# Patient Record
Sex: Male | Born: 2000 | Race: White | Hispanic: No | Marital: Single | Smoking: Never smoker
Health system: Southern US, Community
[De-identification: ages and names within clinical notes are randomized; demographics above are authoritative.]

## PROBLEM LIST (undated history)

## (undated) HISTORY — PX: NO PAST SURGERIES: SHX2092

---
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
## 2021-04-23 HISTORY — PX: WRIST ARTHROSCOPY: SUR100

## 2021-06-03 DIAGNOSIS — M25531 Pain in right wrist: Secondary | ICD-10-CM | POA: Diagnosis not present

## 2021-06-03 DIAGNOSIS — M25532 Pain in left wrist: Secondary | ICD-10-CM | POA: Diagnosis not present

## 2021-06-10 DIAGNOSIS — M25532 Pain in left wrist: Secondary | ICD-10-CM | POA: Diagnosis not present

## 2021-06-10 DIAGNOSIS — M6281 Muscle weakness (generalized): Secondary | ICD-10-CM | POA: Diagnosis not present

## 2021-06-12 DIAGNOSIS — M25532 Pain in left wrist: Secondary | ICD-10-CM | POA: Diagnosis not present

## 2021-06-12 DIAGNOSIS — M6281 Muscle weakness (generalized): Secondary | ICD-10-CM | POA: Diagnosis not present

## 2021-06-15 DIAGNOSIS — M25532 Pain in left wrist: Secondary | ICD-10-CM | POA: Diagnosis not present

## 2021-06-15 DIAGNOSIS — M6281 Muscle weakness (generalized): Secondary | ICD-10-CM | POA: Diagnosis not present

## 2021-06-18 DIAGNOSIS — M6281 Muscle weakness (generalized): Secondary | ICD-10-CM | POA: Diagnosis not present

## 2021-06-18 DIAGNOSIS — M25532 Pain in left wrist: Secondary | ICD-10-CM | POA: Diagnosis not present

## 2021-06-22 DIAGNOSIS — M25532 Pain in left wrist: Secondary | ICD-10-CM | POA: Diagnosis not present

## 2021-06-22 DIAGNOSIS — M6281 Muscle weakness (generalized): Secondary | ICD-10-CM | POA: Diagnosis not present

## 2021-06-25 DIAGNOSIS — M25532 Pain in left wrist: Secondary | ICD-10-CM | POA: Diagnosis not present

## 2021-06-25 DIAGNOSIS — M6281 Muscle weakness (generalized): Secondary | ICD-10-CM | POA: Diagnosis not present

## 2021-06-29 DIAGNOSIS — M6281 Muscle weakness (generalized): Secondary | ICD-10-CM | POA: Diagnosis not present

## 2021-06-29 DIAGNOSIS — M25532 Pain in left wrist: Secondary | ICD-10-CM | POA: Diagnosis not present

## 2021-07-02 DIAGNOSIS — M6281 Muscle weakness (generalized): Secondary | ICD-10-CM | POA: Diagnosis not present

## 2021-07-02 DIAGNOSIS — M25532 Pain in left wrist: Secondary | ICD-10-CM | POA: Diagnosis not present

## 2021-07-06 DIAGNOSIS — M25532 Pain in left wrist: Secondary | ICD-10-CM | POA: Diagnosis not present

## 2021-07-06 DIAGNOSIS — M6281 Muscle weakness (generalized): Secondary | ICD-10-CM | POA: Diagnosis not present

## 2021-07-09 DIAGNOSIS — M25532 Pain in left wrist: Secondary | ICD-10-CM | POA: Diagnosis not present

## 2021-07-09 DIAGNOSIS — M6281 Muscle weakness (generalized): Secondary | ICD-10-CM | POA: Diagnosis not present

## 2021-07-13 DIAGNOSIS — M25532 Pain in left wrist: Secondary | ICD-10-CM | POA: Diagnosis not present

## 2021-07-13 DIAGNOSIS — M6281 Muscle weakness (generalized): Secondary | ICD-10-CM | POA: Diagnosis not present

## 2021-07-16 DIAGNOSIS — M6281 Muscle weakness (generalized): Secondary | ICD-10-CM | POA: Diagnosis not present

## 2021-07-16 DIAGNOSIS — M25532 Pain in left wrist: Secondary | ICD-10-CM | POA: Diagnosis not present

## 2021-11-30 DIAGNOSIS — N390 Urinary tract infection, site not specified: Secondary | ICD-10-CM | POA: Diagnosis not present

## 2021-11-30 DIAGNOSIS — R319 Hematuria, unspecified: Secondary | ICD-10-CM | POA: Diagnosis not present

## 2021-12-12 NOTE — Patient Instructions (Signed)

## 2021-12-15 ENCOUNTER — Encounter: Payer: Self-pay | Admitting: Nurse Practitioner

## 2021-12-15 ENCOUNTER — Ambulatory Visit (INDEPENDENT_AMBULATORY_CARE_PROVIDER_SITE_OTHER): Payer: BC Managed Care – PPO | Admitting: Nurse Practitioner

## 2021-12-15 VITALS — BP 97/61 | HR 61 | Temp 98.1°F | Ht 71.0 in | Wt 178.3 lb

## 2021-12-15 DIAGNOSIS — M25532 Pain in left wrist: Secondary | ICD-10-CM | POA: Diagnosis not present

## 2021-12-15 DIAGNOSIS — M25531 Pain in right wrist: Secondary | ICD-10-CM

## 2021-12-15 DIAGNOSIS — Z13220 Encounter for screening for lipoid disorders: Secondary | ICD-10-CM

## 2021-12-15 DIAGNOSIS — Z136 Encounter for screening for cardiovascular disorders: Secondary | ICD-10-CM

## 2021-12-15 DIAGNOSIS — Z0000 Encounter for general adult medical examination without abnormal findings: Secondary | ICD-10-CM | POA: Diagnosis not present

## 2021-12-15 DIAGNOSIS — R0981 Nasal congestion: Secondary | ICD-10-CM
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
## 2021-12-15 NOTE — Assessment & Plan Note (Signed)
Continue to collaborate with ortho, recent notes reviewed.

## 2021-12-16 LAB — CBC WITH DIFFERENTIAL/PLATELET
Basophils Absolute: 0 10*3/uL (ref 0.0–0.2)
Basos: 1 %
EOS (ABSOLUTE): 0.1 10*3/uL (ref 0.0–0.4)
Eos: 1 %
Hematocrit: 44.2 % (ref 37.5–51.0)
Hemoglobin: 15.4 g/dL (ref 13.0–17.7)
Immature Grans (Abs): 0 10*3/uL (ref 0.0–0.1)
Immature Granulocytes: 0 %
Lymphocytes Absolute: 1.8 10*3/uL (ref 0.7–3.1)
Lymphs: 32 %
MCH: 29.3 pg (ref 26.6–33.0)
MCHC: 34.8 g/dL (ref 31.5–35.7)
MCV: 84 fL (ref 79–97)
Monocytes Absolute: 0.6 10*3/uL (ref 0.1–0.9)
Monocytes: 10 %
Neutrophils Absolute: 3.2 10*3/uL (ref 1.4–7.0)
Neutrophils: 56 %
Platelets: 228 10*3/uL (ref 150–450)
RBC: 5.25 x10E6/uL (ref 4.14–5.80)
RDW: 12.9 % (ref 11.6–15.4)
WBC: 5.7 10*3/uL (ref 3.4–10.8)

## 2021-12-16 LAB — COMPREHENSIVE METABOLIC PANEL
ALT: 32 IU/L (ref 0–44)
AST: 24 IU/L (ref 0–40)
Albumin/Globulin Ratio: 1.9 (ref 1.2–2.2)
Albumin: 4.9 g/dL (ref 4.3–5.2)
Alkaline Phosphatase: 104 IU/L (ref 51–125)
BUN/Creatinine Ratio: 11 (ref 9–20)
BUN: 10 mg/dL (ref 6–20)
Bilirubin Total: 0.8 mg/dL (ref 0.0–1.2)
CO2: 24 mmol/L (ref 20–29)
Calcium: 10.2 mg/dL (ref 8.7–10.2)
Chloride: 102 mmol/L (ref 96–106)
Creatinine, Ser: 0.9 mg/dL (ref 0.76–1.27)
Globulin, Total: 2.6 g/dL (ref 1.5–4.5)
Glucose: 83 mg/dL (ref 70–99)
Potassium: 4 mmol/L (ref 3.5–5.2)
Sodium: 140 mmol/L (ref 134–144)
Total Protein: 7.5 g/dL (ref 6.0–8.5)
eGFR: 125 mL/min/{1.73_m2} (ref >59)

## 2021-12-16 LAB — LIPID PANEL W/O CHOL/HDL RATIO
Cholesterol, Total: 133 mg/dL (ref 100–199)
HDL: 37 mg/dL — ABNORMAL LOW (ref >39)
LDL Chol Calc (NIH): 82 mg/dL (ref 0–99)
Triglycerides: 71 mg/dL (ref 0–149)
VLDL Cholesterol Cal: 14 mg/dL (ref 5–40)

## 2021-12-16 LAB — TSH: TSH: 1.09 u[IU]/mL (ref 0.450–4.500)

## 2021-12-16 NOTE — Progress Notes (Signed)
Contacted via MyChart   Good morning Joshua Cardenas, your labs have returned and are overall nice and normal.  These are great labs!!  Keep up the good work at taking care of your health. Keep being awesome!!  Thank you for allowing me to participate in your care.  I appreciate you. Kindest regards, Arlicia Paquette

## 2021-12-30 DIAGNOSIS — J3489 Other specified disorders of nose and nasal sinuses: Secondary | ICD-10-CM | POA: Diagnosis not present

## 2021-12-30 DIAGNOSIS — J309 Allergic rhinitis, unspecified: Secondary | ICD-10-CM | POA: Diagnosis not present

## 2022-01-08 DIAGNOSIS — L720 Epidermal cyst: Secondary | ICD-10-CM | POA: Diagnosis not present

## 2022-01-08 DIAGNOSIS — L818 Other specified disorders of pigmentation: Secondary | ICD-10-CM | POA: Diagnosis not present

## 2022-05-06 DIAGNOSIS — J3489 Other specified disorders of nose and nasal sinuses: Secondary | ICD-10-CM | POA: Diagnosis not present

## 2022-05-06 DIAGNOSIS — S0992XS Unspecified injury of nose, sequela: Secondary | ICD-10-CM | POA: Diagnosis not present

## 2022-05-06 DIAGNOSIS — M950 Acquired deformity of nose: Secondary | ICD-10-CM | POA: Diagnosis not present

## 2022-05-06 DIAGNOSIS — J342 Deviated nasal septum: Secondary | ICD-10-CM | POA: Diagnosis not present

## 2022-09-22 HISTORY — PX: NASAL SEPTUM SURGERY: SHX37

## 2022-09-30 DIAGNOSIS — J342 Deviated nasal septum: Secondary | ICD-10-CM | POA: Diagnosis not present

## 2022-09-30 DIAGNOSIS — S0992XS Unspecified injury of nose, sequela: Secondary | ICD-10-CM | POA: Diagnosis not present

## 2022-09-30 DIAGNOSIS — J3489 Other specified disorders of nose and nasal sinuses: Secondary | ICD-10-CM | POA: Diagnosis not present

## 2022-09-30 DIAGNOSIS — M950 Acquired deformity of nose: Secondary | ICD-10-CM | POA: Diagnosis not present

## 2022-10-13 DIAGNOSIS — Z79899 Other long term (current) drug therapy: Secondary | ICD-10-CM | POA: Diagnosis not present

## 2022-10-13 DIAGNOSIS — J342 Deviated nasal septum: Secondary | ICD-10-CM | POA: Diagnosis not present

## 2022-10-13 DIAGNOSIS — J343 Hypertrophy of nasal turbinates: Secondary | ICD-10-CM | POA: Diagnosis not present

## 2022-10-13 DIAGNOSIS — J3489 Other specified disorders of nose and nasal sinuses: Secondary | ICD-10-CM | POA: Diagnosis not present

## 2022-10-13 DIAGNOSIS — M950 Acquired deformity of nose: Secondary | ICD-10-CM | POA: Diagnosis not present

## 2022-10-14 DIAGNOSIS — Z79899 Other long term (current) drug therapy: Secondary | ICD-10-CM | POA: Diagnosis not present

## 2022-10-14 DIAGNOSIS — M950 Acquired deformity of nose: Secondary | ICD-10-CM | POA: Diagnosis not present

## 2022-10-14 DIAGNOSIS — J3489 Other specified disorders of nose and nasal sinuses: Secondary | ICD-10-CM | POA: Diagnosis not present

## 2022-10-14 DIAGNOSIS — J342 Deviated nasal septum: Secondary | ICD-10-CM | POA: Diagnosis not present

## 2022-12-12 NOTE — Patient Instructions (Signed)

## 2022-12-17 ENCOUNTER — Ambulatory Visit (INDEPENDENT_AMBULATORY_CARE_PROVIDER_SITE_OTHER): Payer: BC Managed Care – PPO | Admitting: Nurse Practitioner

## 2022-12-17 ENCOUNTER — Encounter: Payer: Self-pay | Admitting: Nurse Practitioner

## 2022-12-17 VITALS — BP 121/78 | HR 59 | Temp 98.1°F | Ht 70.6 in | Wt 181.4 lb

## 2022-12-17 DIAGNOSIS — Z13220 Encounter for screening for lipoid disorders: Secondary | ICD-10-CM

## 2022-12-17 DIAGNOSIS — Z0000 Encounter for general adult medical examination without abnormal findings: Secondary | ICD-10-CM

## 2022-12-17 DIAGNOSIS — Z136 Encounter for screening for cardiovascular disorders: Secondary | ICD-10-CM | POA: Diagnosis not present

## 2022-12-17 DIAGNOSIS — R0981 Nasal congestion: Secondary | ICD-10-CM | POA: Diagnosis not present

## 2022-12-17 NOTE — Assessment & Plan Note (Signed)
Ongoing issue, continue current regimen and collaboration with ENT as needed.

## 2022-12-17 NOTE — Progress Notes (Signed)
BP 121/78   Pulse (!) 59   Temp 98.1 F (36.7 C) (Oral)   Ht 5' 10.6" (1.793 m)   Wt 181 lb 6.4 oz (82.3 kg)   SpO2 98%   BMI 25.59 kg/m    Subjective:    Patient ID: Joshua Cardenas, male    DOB: [birth year], 22 y.o.   MRN: [id number]  HPI: Joshua Cardenas is a 22 y.o. male presenting on 12/17/2022 for comprehensive medical examination. Current medical complaints include: congestion  He currently lives with: family Interim Problems from his last visit:  as above  Functional Status Survey: Is the patient deaf or have difficulty hearing?: No Does the patient have difficulty seeing, even when wearing glasses/contacts?: No Does the patient have difficulty concentrating, remembering, or making decisions?: No Does the patient have difficulty walking or climbing stairs?: No Does the patient have difficulty dressing or bathing?: No Does the patient have difficulty doing errands alone such as visiting a doctor's office or shopping?: No  FALL RISK:    12/17/2022    3:07 PM 12/15/2021    3:15 PM  Fall Risk   Falls in the past year? 0 0  Number falls in past yr: 0 0  Injury with Fall? 0 0  Risk for fall due to : No Fall Risks No Fall Risks  Follow up Falls evaluation completed Falls evaluation completed    Depression Screen    12/17/2022    3:08 PM 12/15/2021    3:15 PM 11/11/2020    2:24 PM  Depression screen PHQ 2/9  Decreased Interest 0 0 0  Down, Depressed, Hopeless 0 0 0  PHQ - 2 Score 0 0 0  Altered sleeping 0 0   Tired, decreased energy 0 0   Change in appetite 0 0   Feeling bad or failure about yourself  0 0   Trouble concentrating 0 0   Moving slowly or fidgety/restless 0 0   Suicidal thoughts 0 0   PHQ-9 Score 0 0   Difficult doing work/chores Not difficult at all Not difficult at all       12/17/2022    3:08 PM 12/15/2021    3:15 PM  GAD 7 : Generalized Anxiety Score  Nervous, Anxious, on Edge 0 0  Control/stop worrying 0 0  Worry too much - different things 0 0   Trouble relaxing 0 0  Restless 0 0  Easily annoyed or irritable 0 0  Afraid - awful might happen 0 0  Total GAD 7 Score 0 0  Anxiety Difficulty Not difficult at all Not difficult at all    Past Medical History:  History reviewed. No pertinent past medical history.  Surgical History:  Past Surgical History:  Procedure Laterality Date   NASAL SEPTUM SURGERY  09/2022   WRIST ARTHROSCOPY Left 04/2021    Medications:  Current Outpatient Medications on File Prior to Visit  Medication Sig   melatonin 3 MG TABS tablet Take 3 mg by mouth at bedtime.   Multiple Vitamin (MULTIVITAMIN) tablet Take 1 tablet by mouth daily.   No current facility-administered medications on file prior to visit.   Allergies:  No Known Allergies  Social History:  Social History   Socioeconomic History   Marital status: Single    Spouse name: Not on file   Number of children: Not on file   Years of education: Not on file   Highest education level: Not on file  Occupational History   Not on file  Tobacco Use   Smoking status: Never   Smokeless tobacco: Never  Vaping Use   Vaping status: Never Used  Substance and Sexual Activity   Alcohol use: Never   Drug use: Never   Sexual activity: Not Currently  Other Topics Concern   Not on file  Social History Narrative   Not on file   Social Determinants of Health   Financial Resource Strain: Low Risk  (11/11/2020)   Overall Financial Resource Strain (CARDIA)    Difficulty of Paying Living Expenses: Not hard at all  Food Insecurity: No Food Insecurity (11/11/2020)   Hunger Vital Sign    Worried About Running Out of Food in the Last Year: Never true    Ran Out of Food in the Last Year: Never true  Transportation Needs: No Transportation Needs (11/11/2020)   PRAPARE - Administrator, Civil Service (Medical): No    Lack of Transportation (Non-Medical): No  Physical Activity: Sufficiently Active (11/11/2020)   Exercise Vital Sign    Days  of Exercise per Week: 5 days    Minutes of Exercise per Session: 60 min  Stress: No Stress Concern Present (11/11/2020)   [employer] of Occupational Health - Occupational Stress Questionnaire    Feeling of Stress : Only a little  Social Connections: Socially Isolated (11/11/2020)   Social Connection and Isolation Panel [NHANES]    Frequency of Communication with Friends and Family: More than three times a week    Frequency of Social Gatherings with Friends and Family: More than three times a week    Attends Religious Services: Never    Database administrator or Organizations: No    Attends Banker Meetings: Never    Marital Status: Never married  Intimate Partner Violence: Not At Risk (11/11/2020)   Humiliation, Afraid, Rape, and Kick questionnaire    Fear of Current or Ex-Partner: No    Emotionally Abused: No    Physically Abused: No    Sexually Abused: No   Social History   Tobacco Use  Smoking Status Never  Smokeless Tobacco Never   Social History   Substance and Sexual Activity  Alcohol Use Never    Family History:  Family History  Problem Relation Age of Onset   Healthy Mother    Healthy Father    Alcohol abuse Father    Alzheimer's disease Maternal Grandmother    Parkinson's disease Paternal Grandmother    Cancer Paternal Grandfather    Cancer Paternal Aunt    Diabetes Maternal Aunt     Past medical history, surgical history, medications, allergies, family history and social history reviewed with patient today and changes made to appropriate areas of the chart.   ROS All other ROS negative except what is listed above and in the HPI.      Objective:    BP 121/78   Pulse (!) 59   Temp 98.1 F (36.7 C) (Oral)   Ht 5' 10.6" (1.793 m)   Wt 181 lb 6.4 oz (82.3 kg)   SpO2 98%   BMI 25.59 kg/m   Wt Readings from Last 3 Encounters:  12/17/22 181 lb 6.4 oz (82.3 kg)  12/15/21 178 lb 4.8 oz (80.9 kg)  12/12/20 167 lb 9.6 oz (76 kg) (71%, Z=  0.56)*   * Growth percentiles are based on CDC (Boys, 2-20 Years) data.    Physical Exam Vitals and nursing note reviewed.  Constitutional:      General: He is awake.  He is not in acute distress.    Appearance: He is well-developed and well-groomed. He is not ill-appearing or toxic-appearing.  HENT:     Head: Normocephalic and atraumatic.     Right Ear: Hearing, tympanic membrane, ear canal and external ear normal. No drainage.     Left Ear: Hearing, tympanic membrane, ear canal and external ear normal. No drainage.     Nose: Nose normal.     Mouth/Throat:     Pharynx: Uvula midline.  Eyes:     General: Lids are normal.        Right eye: No discharge.        Left eye: No discharge.     Extraocular Movements: Extraocular movements intact.     Conjunctiva/sclera: Conjunctivae normal.     Pupils: Pupils are equal, round, and reactive to light.     Visual Fields: Right eye visual fields normal and left eye visual fields normal.  Neck:     Thyroid: No thyromegaly.     Vascular: No carotid bruit or JVD.     Trachea: Trachea normal.  Cardiovascular:     Rate and Rhythm: Normal rate and regular rhythm.     Heart sounds: Normal heart sounds, S1 normal and S2 normal. No murmur heard.    No gallop.  Pulmonary:     Effort: Pulmonary effort is normal. No accessory muscle usage or respiratory distress.     Breath sounds: Normal breath sounds.  Abdominal:     General: Bowel sounds are normal.     Palpations: Abdomen is soft. There is no hepatomegaly or splenomegaly.     Tenderness: There is no abdominal tenderness.  Musculoskeletal:        General: Normal range of motion.     Cervical back: Normal range of motion and neck supple.     Right lower leg: No edema.     Left lower leg: No edema.  Lymphadenopathy:     Head:     Right side of head: No submental, submandibular, tonsillar, preauricular or posterior auricular adenopathy.     Left side of head: No submental, submandibular,  tonsillar, preauricular or posterior auricular adenopathy.     Cervical: No cervical adenopathy.  Skin:    General: Skin is warm and dry.     Capillary Refill: Capillary refill takes less than 2 seconds.     Findings: No rash.  Neurological:     Mental Status: He is alert and oriented to person, place, and time.     Gait: Gait is intact.     Deep Tendon Reflexes: Reflexes are normal and symmetric.     Reflex Scores:      Brachioradialis reflexes are 2+ on the right side and 2+ on the left side.      Patellar reflexes are 2+ on the right side and 2+ on the left side. Psychiatric:        Attention and Perception: Attention normal.        Mood and Affect: Mood normal.        Speech: Speech normal.        Behavior: Behavior normal. Behavior is cooperative.        Thought Content: Thought content normal.        Cognition and Memory: Cognition normal.      Results for orders placed or performed in visit on 12/15/21  CBC with Differential/Platelet  Result Value Ref Range   WBC 5.7 3.4 - 10.8 x10E3/uL  RBC 5.25 4.14 - 5.80 x10E6/uL   Hemoglobin 15.4 13.0 - 17.7 g/dL   Hematocrit 29.5 28.4 - 51.0 %   MCV 84 79 - 97 fL   MCH 29.3 26.6 - 33.0 pg   MCHC 34.8 31.5 - 35.7 g/dL   RDW 13.2 44.0 - 10.2 %   Platelets 228 150 - 450 x10E3/uL   Neutrophils 56 Not Estab. %   Lymphs 32 Not Estab. %   Monocytes 10 Not Estab. %   Eos 1 Not Estab. %   Basos 1 Not Estab. %   Neutrophils Absolute 3.2 1.4 - 7.0 x10E3/uL   Lymphocytes Absolute 1.8 0.7 - 3.1 x10E3/uL   Monocytes Absolute 0.6 0.1 - 0.9 x10E3/uL   EOS (ABSOLUTE) 0.1 0.0 - 0.4 x10E3/uL   Basophils Absolute 0.0 0.0 - 0.2 x10E3/uL   Immature Granulocytes 0 Not Estab. %   Immature Grans (Abs) 0.0 0.0 - 0.1 x10E3/uL  Comprehensive metabolic panel  Result Value Ref Range   Glucose 83 70 - 99 mg/dL   BUN 10 6 - 20 mg/dL   Creatinine, Ser 7.25 0.76 - 1.27 mg/dL   eGFR 366 >44 IH/KVQ/2.59   BUN/Creatinine Ratio 11 9 - 20   Sodium 140  134 - 144 mmol/L   Potassium 4.0 3.5 - 5.2 mmol/L   Chloride 102 96 - 106 mmol/L   CO2 24 20 - 29 mmol/L   Calcium 10.2 8.7 - 10.2 mg/dL   Total Protein 7.5 6.0 - 8.5 g/dL   Albumin 4.9 4.3 - 5.2 g/dL   Globulin, Total 2.6 1.5 - 4.5 g/dL   Albumin/Globulin Ratio 1.9 1.2 - 2.2   Bilirubin Total 0.8 0.0 - 1.2 mg/dL   Alkaline Phosphatase 104 51 - 125 IU/L   AST 24 0 - 40 IU/L   ALT 32 0 - 44 IU/L  Lipid Panel w/o Chol/HDL Ratio  Result Value Ref Range   Cholesterol, Total 133 100 - 199 mg/dL   Triglycerides 71 0 - 149 mg/dL   HDL 37 (L) >56 mg/dL   VLDL Cholesterol Cal 14 5 - 40 mg/dL   LDL Chol Calc (NIH) 82 0 - 99 mg/dL  TSH  Result Value Ref Range   TSH 1.090 0.450 - 4.500 uIU/mL      Assessment & Plan:   Problem List Items Addressed This Visit       Other   Chronic nasal congestion - Primary    Ongoing issue, continue current regimen and collaboration with ENT as needed.      Relevant Orders   CBC with Differential/Platelet   Comprehensive metabolic panel   Other Visit Diagnoses     Encounter for lipid screening for cardiovascular disease       Lipid panel on labs today.   Relevant Orders   Comprehensive metabolic panel   Lipid Panel w/o Chol/HDL Ratio   Encounter for annual physical exam       Annual physical today with labs and health maintenance reviewed, discussed with patient.   Relevant Orders   TSH        LABORATORY TESTING:  Health maintenance labs ordered today as discussed above.   IMMUNIZATIONS:   - Tdap: Tetanus vaccination status reviewed: due -- [facility] - Influenza: Up to date - Pneumovax: Not applicable - Prevnar: Not applicable - Zostavax vaccine: Not applicable  SCREENING: - Colonoscopy: Not applicable  Discussed with patient purpose of the colonoscopy is to detect  colon cancer at curable precancerous or early stages   - AAA Screening: Not applicable  -Hearing Test: Not applicable   -Spirometry: Not applicable  PATIENT COUNSELING:    Sexuality: Discussed sexually transmitted diseases, partner selection, use of condoms, avoidance of unintended pregnancy  and contraceptive alternatives.   Advised to avoid cigarette smoking.  I discussed with the patient that most people either abstain from alcohol or drink within safe limits (<=14/week and <=4 drinks/occasion for males, <=7/weeks and <= 3 drinks/occasion for females) and that the risk for alcohol disorders and other health effects rises proportionally with the number of drinks per week and how often a drinker exceeds daily limits.  Discussed cessation/primary prevention of drug use and availability of treatment for abuse.   Diet: Encouraged to adjust caloric intake to maintain  or achieve ideal body weight, to reduce intake of dietary saturated fat and total fat, to limit sodium intake by avoiding high sodium foods and not adding table salt, and to maintain adequate dietary potassium and calcium preferably from fresh fruits, vegetables, and low-fat dairy products.    Stressed the importance of regular exercise  Injury prevention: Discussed safety belts, safety helmets, smoke detector, smoking near bedding or upholstery.   Dental health: Discussed importance of regular tooth brushing, flossing, and dental visits.   Follow up plan: NEXT PREVENTATIVE PHYSICAL DUE IN 1 YEAR. Return in about 1 year (around 12/17/2023) for Annual Exam.

## 2022-12-18 NOTE — Progress Notes (Signed)
Contacted via MyChart   Good morning Joshua Cardenas, all of your labs have returned and continue to look fantastic.  No concerns on these.  Have a good day!!

## 2023-01-14 DIAGNOSIS — Z09 Encounter for follow-up examination after completed treatment for conditions other than malignant neoplasm: Secondary | ICD-10-CM | POA: Diagnosis not present

## 2023-03-02 ENCOUNTER — Encounter: Payer: Self-pay | Admitting: Nurse Practitioner

## 2023-05-06 DIAGNOSIS — Z09 Encounter for follow-up examination after completed treatment for conditions other than malignant neoplasm: Secondary | ICD-10-CM | POA: Diagnosis not present

## 2023-05-06 DIAGNOSIS — M950 Acquired deformity of nose: Secondary | ICD-10-CM | POA: Diagnosis not present

## 2023-05-06 DIAGNOSIS — J3489 Other specified disorders of nose and nasal sinuses: Secondary | ICD-10-CM | POA: Diagnosis not present

## 2023-10-28 DIAGNOSIS — J342 Deviated nasal septum: Secondary | ICD-10-CM | POA: Diagnosis not present

## 2023-12-19 ENCOUNTER — Ambulatory Visit: Payer: BC Managed Care – PPO | Admitting: Nurse Practitioner

## 2023-12-28 ENCOUNTER — Ambulatory Visit: Payer: Self-pay | Admitting: Nurse Practitioner
